# Patient Record
Sex: Male | Born: 1960 | Race: Black or African American | Hispanic: No | Marital: Single | Smoking: Former smoker
Health system: Southern US, Community
[De-identification: ages and names within clinical notes are randomized; demographics above are authoritative.]

## PROBLEM LIST (undated history)

## (undated) DIAGNOSIS — M545 Low back pain: Secondary | ICD-10-CM

## (undated) DIAGNOSIS — K56600 Partial intestinal obstruction, unspecified as to cause: Secondary | ICD-10-CM

## (undated) DIAGNOSIS — B1910 Unspecified viral hepatitis B without hepatic coma: Secondary | ICD-10-CM

## (undated) DIAGNOSIS — G8929 Other chronic pain: Secondary | ICD-10-CM

## (undated) DIAGNOSIS — F319 Bipolar disorder, unspecified: Secondary | ICD-10-CM

## (undated) HISTORY — PX: TUMOR EXCISION: SHX421

## (undated) HISTORY — PX: SHOULDER OPEN ROTATOR CUFF REPAIR: SHX2407

## (undated) HISTORY — PX: CATARACT EXTRACTION W/ INTRAOCULAR LENS IMPLANT: SHX1309

---

## 2003-02-27 ENCOUNTER — Emergency Department (HOSPITAL_COMMUNITY): Admission: EM | Admit: 2003-02-27 | Discharge: 2003-02-27 | Payer: Self-pay | Admitting: Emergency Medicine

## 2005-01-02 ENCOUNTER — Emergency Department (HOSPITAL_COMMUNITY): Admission: EM | Admit: 2005-01-02 | Discharge: 2005-01-02 | Payer: Self-pay | Admitting: Emergency Medicine

## 2009-07-12 HISTORY — PX: ANKLE FUSION: SHX881

## 2009-11-27 ENCOUNTER — Emergency Department (HOSPITAL_COMMUNITY): Admission: EM | Admit: 2009-11-27 | Discharge: 2009-11-28 | Payer: Self-pay | Admitting: Emergency Medicine

## 2010-02-27 ENCOUNTER — Emergency Department (HOSPITAL_COMMUNITY): Admission: EM | Admit: 2010-02-27 | Discharge: 2010-02-27 | Payer: Self-pay | Admitting: Emergency Medicine

## 2010-09-28 LAB — BASIC METABOLIC PANEL
BUN: 16 mg/dL (ref 6–23)
CO2: 29 mEq/L (ref 19–32)
Chloride: 104 mEq/L (ref 96–112)
GFR calc Af Amer: >60 mL/min (ref >60)
GFR calc non Af Amer: >60 mL/min (ref >60)
Glucose, Bld: 102 mg/dL — ABNORMAL HIGH (ref 70–99)
Potassium: 3.9 mEq/L (ref 3.5–5.1)
Sodium: 137 mEq/L (ref 135–145)

## 2010-09-28 LAB — URINALYSIS, ROUTINE W REFLEX MICROSCOPIC
Bilirubin Urine: NEGATIVE
Ketones, ur: NEGATIVE mg/dL
Protein, ur: NEGATIVE mg/dL
Specific Gravity, Urine: 1.029 (ref 1.005–1.030)
Urobilinogen, UA: 1 mg/dL (ref 0.0–1.0)

## 2010-09-28 LAB — DIFFERENTIAL
Basophils Relative: 1 % (ref 0–1)
Eosinophils Relative: 2 % (ref 0–5)
Lymphocytes Relative: 44 % (ref 12–46)
Monocytes Absolute: 0.9 10*3/uL (ref 0.1–1.0)
Monocytes Relative: 12 % (ref 3–12)
Neutro Abs: 3 10*3/uL (ref 1.7–7.7)

## 2010-09-28 LAB — CBC
Hemoglobin: 14.4 g/dL (ref 13.0–17.0)
Platelets: 229 10*3/uL (ref 150–400)
RDW: 13.6 % (ref 11.5–15.5)
WBC: 7.3 10*3/uL (ref 4.0–10.5)

## 2011-12-09 ENCOUNTER — Encounter (HOSPITAL_COMMUNITY): Payer: Self-pay | Admitting: *Deleted

## 2011-12-09 ENCOUNTER — Emergency Department (HOSPITAL_COMMUNITY)
Admission: EM | Admit: 2011-12-09 | Discharge: 2011-12-09 | Disposition: A | Discharge status: Home / Self Care | Payer: Medicaid Other | Attending: Emergency Medicine | Admitting: Emergency Medicine

## 2011-12-09 DIAGNOSIS — Z87891 Personal history of nicotine dependence: Secondary | ICD-10-CM | POA: Insufficient documentation

## 2011-12-09 DIAGNOSIS — J020 Streptococcal pharyngitis: Secondary | ICD-10-CM

## 2011-12-09 LAB — RAPID STREP SCREEN (MED CTR MEBANE ONLY): Streptococcus, Group A Screen (Direct): POSITIVE — AB

## 2011-12-09 MED ORDER — PENICILLIN G BENZATHINE 1200000 UNIT/2ML IM SUSP
1.2000 10*6.[IU] | Freq: Once | INTRAMUSCULAR | Status: AC
  Administered 2011-12-09: 1.2 10*6.[IU] via INTRAMUSCULAR
  Filled 2011-12-09: qty 2

## 2011-12-09 NOTE — ED Notes (Signed)
Patient states he startted with chills yesterday.

## 2011-12-09 NOTE — ED Provider Notes (Signed)
Medical screening examination/treatment/procedure(s) were performed by non-physician practitioner and as supervising physician I was immediately available for consultation/collaboration.   Mitul Hallowell L Aubryana Vittorio, MD 12/09/11 0422 

## 2011-12-09 NOTE — Discharge Instructions (Signed)
You were seen and evaluated for your sore throat symptoms. You were treated with a one-time dose of penicillin for a positive strep throat test. Your symptoms should improve over the next few days. Continue drink plenty of fluids to rehydrate it. Take him on ibuprofen for pain and fever. Oh (primary care provider for any worsening symptoms.    Strep Throat Strep throat is an infection of the throat caused by a bacteria named Streptococcus pyogenes. Your caregiver may call the infection streptococcal "tonsillitis" or "pharyngitis" depending on whether there are signs of inflammation in the tonsils or back of the throat. Strep throat is most common in children from 50 to 52 years old during the cold months of the year, but it can occur in people of any age during any season. This infection is spread from person to person (contagious) through coughing, sneezing, or other close contact. SYMPTOMS   Fever or chills.   Painful, swollen, red tonsils or throat.   Pain or difficulty when swallowing.   White or yellow spots on the tonsils or throat.   Swollen, tender lymph nodes or "glands" of the neck or under the jaw.   Red rash all over the body (rare).  DIAGNOSIS  Many different infections can cause the same symptoms. A test must be done to confirm the diagnosis so the right treatment can be given. A "rapid strep test" can help your caregiver make the diagnosis in a few minutes. If this test is not available, a light swab of the infected area can be used for a throat culture test. If a throat culture test is done, results are usually available in a day or two. TREATMENT  Strep throat is treated with antibiotic medicine. HOME CARE INSTRUCTIONS   Gargle with 1 tsp of salt in 1 cup of warm water, 3 to 4 times per day or as needed for comfort.   Family members who also have a sore throat or fever should be tested for strep throat and treated with antibiotics if they have the strep infection.   Make  sure everyone in your household washes their hands well.   Do not share food, drinking cups, or personal items that could cause the infection to spread to others.   You may need to eat a soft food diet until your sore throat gets better.   Drink enough water and fluids to keep your urine clear or pale yellow. This will help prevent dehydration.   Get plenty of rest.   Stay home from school, daycare, or work until you have been on antibiotics for 24 hours.   Only take over-the-counter or prescription medicines for pain, discomfort, or fever as directed by your caregiver.   If antibiotics are prescribed, take them as directed. Finish them even if you start to feel better.  SEEK MEDICAL CARE IF:   The glands in your neck continue to enlarge.   You develop a rash, cough, or earache.   You cough up green, yellow-brown, or bloody sputum.   You have pain or discomfort not controlled by medicines.   Your problems seem to be getting worse rather than better.  SEEK IMMEDIATE MEDICAL CARE IF:   You develop any new symptoms such as vomiting, severe headache, stiff or painful neck, chest pain, shortness of breath, or trouble swallowing.   You develop severe throat pain, drooling, or changes in your voice.   You develop swelling of the neck, or the skin on the neck becomes red and  tender.   You have a fever.   You develop signs of dehydration, such as fatigue, dry mouth, and decreased urination.   You become increasingly sleepy, or you cannot wake up completely.  Document Released: 06/25/2000 Document Revised: 06/17/2011 Document Reviewed: 08/27/2010 [facility] Patient Information 2012 [place], [place].     RESOURCE GUIDE  Dental Problems  Patients with Medicaid: [facility] [phone] [address]                                           [phone] [address] Phone:  [phone]                                                  Phone:   [phone]  If unable to pay or uninsured, contact:  [facility] or [facility]. to become qualified for the adult dental clinic.  Chronic Pain Problems Contact [facility]  [phone] Patients need to be referred by their primary care doctor.  Insufficient Money for Medicine Contact [employer]:  call "211" or [facility] [phone].  No Primary Care Doctor Call [facility]  [phone] Other agencies that provide inexpensive medical care    [facility]  [phone]    [facility]  [phone]    [facility]  [phone]    [facility]  [phone]    [facility]  [phone]    [facility]  [phone]  Psychological Services [facility]  [phone] [facility]  [phone] [facility] Mental [facility]   [phone] (emergency services [phone])  Substance Abuse Resources [facility]  [phone] [facility] [phone] [facility] [place] [phone] Floydene Flock [phone] Residential & Outpatient Substance Abuse Program  [phone]  Abuse/Neglect [facility] Child Abuse Hotline 939-726-2534 [facility] Child Abuse Hotline [phone] (After Hours)  Emergency Shelter [facility] [phone]  Maternity Homes [facility] [place] [facility] [phone] [facility] [phone]  MRSA Hotline #:   248-416-9265    [facility] Resources  [facility]     [employer]                          [facility] Dept. [address] [place]                       [address]      [address]  [place]                                                Cristobal Goldmann Phone:  [phone]  Phone:  [phone]                 Phone:  [phone]  [facility] Mental Health Phone:   [phone]  [facility] Child Abuse Hotline [phone] [phone] (After Hours)

## 2011-12-09 NOTE — ED Provider Notes (Signed)
History     CSN: [id number]  Arrival date & time 12/09/11  0016   First MD Initiated Contact with Patient 12/09/11 0030      Chief Complaint  Patient presents with  . Sore Throat   HPI  History provided by the patient. Patient is a 50 year old male with no significant past medical history who presents with complaints of sore throat began yesterday. Patient states that he thinks he got it from his girlfriend who had a sore throat past few days. Her symptoms have clear. Patient states now his throat feels swollen and painful. Pain is worse with swallowing. He denies any difficulty swallowing or eating. Patient denies any difficulty breathing. He denies any cough, rhinorrhea or congestion. He does report some subjective fevers, chills. Patient denies any nausea vomiting symptoms.    History reviewed. No pertinent past medical history.  Past Surgical History  Procedure Date  . Ankle fusion   . Rotator cuff repair     No family history on file.  History  Substance Use Topics  . Smoking status: Former Smoker -- 0.5 packs/day    Types: Cigarettes  . Smokeless tobacco: Not on file  . Alcohol Use: 1.8 oz/week    3 Cans of beer per week      Review of Systems  Constitutional: Positive for fever and chills.  HENT: Positive for sore throat. Negative for congestion, rhinorrhea and trouble swallowing.   Respiratory: Negative for cough and shortness of breath.   Cardiovascular: Negative for chest pain.  Gastrointestinal: Negative for nausea and vomiting.    Allergies  Review of patient's allergies indicates no known allergies.  Home Medications  No current outpatient prescriptions on file.  BP 149/79  Pulse 107  Temp(Src) 98.9 F (37.2 C) (Oral)  Resp 20  SpO2 95%  Physical Exam  Nursing note and vitals reviewed. Constitutional: He appears well-developed and well-nourished. No distress.  HENT:  Head: Normocephalic and atraumatic.       Tonsils mildly enlarged with  erythema bilaterally. There is also bilateral exudate. Uvula midline. No signs for PTA.  Neck: Normal range of motion. Neck supple.       No meningeal signs  Cardiovascular: Normal rate and regular rhythm.   Pulmonary/Chest: Effort normal and breath sounds normal.  Abdominal: Soft.  Lymphadenopathy:    He has cervical adenopathy.  Neurological: He is alert.  Skin: Skin is warm. No rash noted.  Psychiatric: He has a normal mood and affect. His behavior is normal.    ED Course  Procedures  Results for orders placed during the hospital encounter of 12/09/11  RAPID STREP SCREEN      Component Value Range   Streptococcus, Group A Screen (Direct) POSITIVE (*) NEGATIVE        1. Strep throat       MDM  Patient seen and evaluated. Patient no acute distress.  Patient treated with a one-time dose of penicillin for strep throat.      Angus Seller, [place] 12/09/11 [phone]

## 2011-12-09 NOTE — ED Notes (Signed)
Pt c/o sore throat since yesterday.  Pt feels it is so painful he cannot swallow.

## 2012-03-06 ENCOUNTER — Inpatient Hospital Stay (HOSPITAL_COMMUNITY)
Admission: EM | Admit: 2012-03-06 | Discharge: 2012-03-09 | DRG: 390 | Disposition: A | Discharge status: Home / Self Care | Payer: Medicaid Other | Attending: Surgery | Admitting: Surgery

## 2012-03-06 ENCOUNTER — Encounter (HOSPITAL_COMMUNITY): Payer: Self-pay | Admitting: Adult Health

## 2012-03-06 DIAGNOSIS — F1911 Other psychoactive substance abuse, in remission: Secondary | ICD-10-CM | POA: Diagnosis present

## 2012-03-06 DIAGNOSIS — E669 Obesity, unspecified: Secondary | ICD-10-CM | POA: Diagnosis present

## 2012-03-06 DIAGNOSIS — Z8619 Personal history of other infectious and parasitic diseases: Secondary | ICD-10-CM

## 2012-03-06 DIAGNOSIS — Z87891 Personal history of nicotine dependence: Secondary | ICD-10-CM

## 2012-03-06 DIAGNOSIS — K56609 Unspecified intestinal obstruction, unspecified as to partial versus complete obstruction: Principal | ICD-10-CM | POA: Diagnosis present

## 2012-03-06 DIAGNOSIS — K56600 Partial intestinal obstruction, unspecified as to cause: Secondary | ICD-10-CM | POA: Diagnosis present

## 2012-03-06 DIAGNOSIS — R7401 Elevation of levels of liver transaminase levels: Secondary | ICD-10-CM | POA: Diagnosis present

## 2012-03-06 DIAGNOSIS — R112 Nausea with vomiting, unspecified: Secondary | ICD-10-CM

## 2012-03-06 DIAGNOSIS — K429 Umbilical hernia without obstruction or gangrene: Secondary | ICD-10-CM | POA: Diagnosis present

## 2012-03-06 DIAGNOSIS — Z6835 Body mass index (BMI) 35.0-35.9, adult: Secondary | ICD-10-CM

## 2012-03-06 DIAGNOSIS — R7402 Elevation of levels of lactic acid dehydrogenase (LDH): Secondary | ICD-10-CM | POA: Diagnosis present

## 2012-03-06 DIAGNOSIS — Z23 Encounter for immunization: Secondary | ICD-10-CM

## 2012-03-06 HISTORY — DX: Partial intestinal obstruction, unspecified as to cause: K56.600

## 2012-03-06 HISTORY — DX: Other chronic pain: G89.29

## 2012-03-06 HISTORY — DX: Unspecified viral hepatitis B without hepatic coma: B19.10

## 2012-03-06 HISTORY — DX: Low back pain: M54.5

## 2012-03-06 HISTORY — DX: Bipolar disorder, unspecified: F31.9

## 2012-03-06 LAB — CBC WITH DIFFERENTIAL/PLATELET
Eosinophils Absolute: 0 10*3/uL (ref 0.0–0.7)
Eosinophils Relative: 0 % (ref 0–5)
HCT: 47 % (ref 39.0–52.0)
Hemoglobin: 16.6 g/dL (ref 13.0–17.0)
MCH: 27.7 pg (ref 26.0–34.0)
Monocytes Absolute: 0.5 10*3/uL (ref 0.1–1.0)
Neutrophils Relative %: 69 % (ref 43–77)
Platelets: 306 10*3/uL (ref 150–400)
RDW: 13.2 % (ref 11.5–15.5)

## 2012-03-06 LAB — COMPREHENSIVE METABOLIC PANEL
ALT: 72 U/L — ABNORMAL HIGH (ref 0–53)
AST: 55 U/L — ABNORMAL HIGH (ref 0–37)
Albumin: 4.7 g/dL (ref 3.5–5.2)
CO2: 21 mEq/L (ref 19–32)
Chloride: 101 mEq/L (ref 96–112)
Creatinine, Ser: 0.99 mg/dL (ref 0.50–1.35)
GFR calc Af Amer: >90 mL/min (ref >90)
GFR calc non Af Amer: >90 mL/min (ref >90)
Glucose, Bld: 152 mg/dL — ABNORMAL HIGH (ref 70–99)
Sodium: 138 mEq/L (ref 135–145)

## 2012-03-06 LAB — URINALYSIS, ROUTINE W REFLEX MICROSCOPIC
Bilirubin Urine: NEGATIVE
Hgb urine dipstick: NEGATIVE
Leukocytes, UA: NEGATIVE
Nitrite: NEGATIVE
Protein, ur: 100 mg/dL — AB
Urobilinogen, UA: 0.2 mg/dL (ref 0.0–1.0)
pH: 5.5 (ref 5.0–8.0)

## 2012-03-06 NOTE — ED Notes (Addendum)
C/o upper abdominal pain, nausea and vomiting that started yesterday. Pt states he is unable to keep anything on stomach. Normal BM yesterday. Denies Chest pain.  States the "pain feels like snakes in my belly"

## 2012-03-07 ENCOUNTER — Emergency Department (HOSPITAL_COMMUNITY): Payer: Medicaid Other

## 2012-03-07 ENCOUNTER — Encounter (HOSPITAL_COMMUNITY): Payer: Self-pay | Admitting: Physical Medicine and Rehabilitation

## 2012-03-07 DIAGNOSIS — R109 Unspecified abdominal pain: Secondary | ICD-10-CM

## 2012-03-07 DIAGNOSIS — R112 Nausea with vomiting, unspecified: Secondary | ICD-10-CM

## 2012-03-07 DIAGNOSIS — K56600 Partial intestinal obstruction, unspecified as to cause: Secondary | ICD-10-CM

## 2012-03-07 HISTORY — DX: Partial intestinal obstruction, unspecified as to cause: K56.600

## 2012-03-07 MED ORDER — SODIUM CHLORIDE 0.9 % IV SOLN
Freq: Once | INTRAVENOUS | Status: AC
  Administered 2012-03-07: 06:00:00 via INTRAVENOUS

## 2012-03-07 MED ORDER — BIOTENE DRY MOUTH MT LIQD
15.0000 mL | Freq: Two times a day (BID) | OROMUCOSAL | Status: DC
  Administered 2012-03-07 – 2012-03-08 (×3): 15 mL via OROMUCOSAL

## 2012-03-07 MED ORDER — ONDANSETRON HCL 4 MG/2ML IJ SOLN
4.0000 mg | Freq: Once | INTRAMUSCULAR | Status: AC
  Administered 2012-03-07: 4 mg via INTRAVENOUS
  Filled 2012-03-07: qty 2

## 2012-03-07 MED ORDER — ONDANSETRON HCL 4 MG/2ML IJ SOLN: 4.0000 mg | Freq: Four times a day (QID) | INTRAMUSCULAR | Status: DC | PRN

## 2012-03-07 MED ORDER — KCL IN DEXTROSE-NACL 20-5-0.45 MEQ/L-%-% IV SOLN
INTRAVENOUS | Status: DC
  Administered 2012-03-07 – 2012-03-08 (×4): via INTRAVENOUS
  Filled 2012-03-07 (×6): qty 1000

## 2012-03-07 MED ORDER — IOHEXOL 300 MG/ML  SOLN
40.0000 mL | Freq: Once | INTRAMUSCULAR | Status: AC | PRN
  Administered 2012-03-07: 40 mL via ORAL

## 2012-03-07 MED ORDER — WHITE PETROLATUM GEL
Status: AC
  Administered 2012-03-07: 14:00:00
  Filled 2012-03-07: qty 5

## 2012-03-07 MED ORDER — MORPHINE SULFATE 4 MG/ML IJ SOLN
4.0000 mg | Freq: Once | INTRAMUSCULAR | Status: AC
  Administered 2012-03-07: 4 mg via INTRAVENOUS
  Filled 2012-03-07: qty 1

## 2012-03-07 MED ORDER — SODIUM CHLORIDE 0.9 % IV BOLUS (SEPSIS)
1000.0000 mL | Freq: Once | INTRAVENOUS | Status: AC
  Administered 2012-03-07: 1000 mL via INTRAVENOUS

## 2012-03-07 MED ORDER — MORPHINE SULFATE 2 MG/ML IJ SOLN
1.0000 mg | INTRAMUSCULAR | Status: DC | PRN
  Administered 2012-03-07 – 2012-03-08 (×5): 2 mg via INTRAVENOUS
  Filled 2012-03-07 (×5): qty 1

## 2012-03-07 MED ORDER — PNEUMOCOCCAL VAC POLYVALENT 25 MCG/0.5ML IJ INJ
0.5000 mL | INJECTION | INTRAMUSCULAR | Status: AC
  Administered 2012-03-08: 0.5 mL via INTRAMUSCULAR
  Filled 2012-03-07: qty 0.5

## 2012-03-07 MED ORDER — IOHEXOL 300 MG/ML  SOLN: 20.0000 mL | INTRAMUSCULAR | Status: DC

## 2012-03-07 MED ORDER — ENOXAPARIN SODIUM 40 MG/0.4ML ~~LOC~~ SOLN
40.0000 mg | SUBCUTANEOUS | Status: DC
  Administered 2012-03-07 – 2012-03-09 (×3): 40 mg via SUBCUTANEOUS
  Filled 2012-03-07 (×3): qty 0.4

## 2012-03-07 MED ORDER — CHLORHEXIDINE GLUCONATE 0.12 % MT SOLN
15.0000 mL | Freq: Two times a day (BID) | OROMUCOSAL | Status: DC
  Administered 2012-03-07 – 2012-03-09 (×3): 15 mL via OROMUCOSAL
  Filled 2012-03-07: qty 15

## 2012-03-07 MED ORDER — IOHEXOL 300 MG/ML  SOLN
100.0000 mL | Freq: Once | INTRAMUSCULAR | Status: AC | PRN
  Administered 2012-03-07: 100 mL via INTRAVENOUS

## 2012-03-07 MED ORDER — PANTOPRAZOLE SODIUM 40 MG IV SOLR
40.0000 mg | Freq: Every day | INTRAVENOUS | Status: DC
  Administered 2012-03-07 – 2012-03-08 (×2): 40 mg via INTRAVENOUS
  Filled 2012-03-07 (×3): qty 40

## 2012-03-07 NOTE — ED Notes (Signed)
Pt returned from CT and reporting increased abd pain. EDP updated.

## 2012-03-07 NOTE — ED Provider Notes (Signed)
History     CSN: [id number]  Arrival date & time 03/06/12  2132   First MD Initiated Contact with Patient 03/07/12 0002      Chief Complaint  Patient presents with  . Emesis   HPI  History provided by the patient. Patient is a 51 year old [place] male with history of hepatitis presents with complaints of diffuse abdominal pain and cramping with episodes of vomiting that began yesterday. Patient states symptoms came on acutely short after eating. He denies eating any unusual foods or undercooked meats. He denies any fever, chills or sweats. No constipation or diarrhea. Abdominal pain is diffuse and intermittent. Patient states pains feel like "snakes crawling and belly". He denies any hematemesis. Patient denies having similar symptoms previously. Patient denies any recent heavy alcohol use and states that he recently quit. Patient denies ever having heavy or daily alcohol use. No prior history of pancreatitis. Patient denies any recent travel. No known sick contacts.     Past Medical History  Diagnosis Date  . Hepatitis     Past Surgical History  Procedure Date  . Ankle fusion   . Rotator cuff repair     No family history on file.  History  Substance Use Topics  . Smoking status: Former Smoker -- 0.5 packs/day    Types: Cigarettes  . Smokeless tobacco: Not on file  . Alcohol Use: 1.8 oz/week    3 Cans of beer per week      Review of Systems  Constitutional: Negative for fever, chills and appetite change.  Respiratory: Negative for cough and shortness of breath.   Gastrointestinal: Positive for abdominal pain. Negative for nausea, vomiting, diarrhea and constipation.  Genitourinary: Negative for dysuria, frequency and hematuria.    Allergies  Review of patient's allergies indicates no known allergies.  Home Medications  No current outpatient prescriptions on file.  BP 147/96  Pulse 71  Temp 98 F (36.7 C)  Resp 18  SpO2 96%  Physical Exam    Nursing note and vitals reviewed. Constitutional: He is oriented to person, place, and time. He appears well-developed and well-nourished. No distress.  HENT:  Head: Normocephalic.  Cardiovascular: Normal rate and regular rhythm.   Pulmonary/Chest: Effort normal and breath sounds normal. No respiratory distress. He has no wheezes. He has no rales.  Abdominal: Soft. Bowel sounds are normal. He exhibits distension. He exhibits no mass. There is tenderness. There is no rebound and no guarding. Hernia confirmed negative in the right inguinal area and confirmed negative in the left inguinal area.       Diffuse abdominal tenderness. There is a soft reducible small umbilical hernia.  Genitourinary: Testes normal. Uncircumcised.  Neurological: He is alert and oriented to person, place, and time.  Skin: Skin is warm.  Psychiatric: He has a normal mood and affect. His behavior is normal.    ED Course  Procedures   Results for orders placed during the hospital encounter of 03/06/12  CBC WITH DIFFERENTIAL      Component Value Range   WBC 8.0  4.0 - 10.5 K/uL   RBC 5.99 (*) 4.22 - 5.81 MIL/uL   Hemoglobin 16.6  13.0 - 17.0 g/dL   HCT 40.9  81.1 - 91.4 %   MCV 78.5  78.0 - 100.0 fL   MCH 27.7  26.0 - 34.0 pg   MCHC 35.3  30.0 - 36.0 g/dL   RDW 78.2  95.6 - 21.3 %   Platelets 306  150 - 400  K/uL   Neutrophils Relative 69  43 - 77 %   Neutro Abs 5.6  1.7 - 7.7 K/uL   Lymphocytes Relative 25  12 - 46 %   Lymphs Abs 2.0  0.7 - 4.0 K/uL   Monocytes Relative 6  3 - 12 %   Monocytes Absolute 0.5  0.1 - 1.0 K/uL   Eosinophils Relative 0  0 - 5 %   Eosinophils Absolute 0.0  0.0 - 0.7 K/uL   Basophils Relative 0  0 - 1 %   Basophils Absolute 0.0  0.0 - 0.1 K/uL  COMPREHENSIVE METABOLIC PANEL      Component Value Range   Sodium 138  135 - 145 mEq/L   Potassium 4.3  3.5 - 5.1 mEq/L   Chloride 101  96 - 112 mEq/L   CO2 21  19 - 32 mEq/L   Glucose, Bld 152 (*) 70 - 99 mg/dL   BUN 18  6 - 23 mg/dL    Creatinine, Ser 4.09  0.50 - 1.35 mg/dL   Calcium 81.1 (*) 8.4 - 10.5 mg/dL   Total Protein 9.3 (*) 6.0 - 8.3 g/dL   Albumin 4.7  3.5 - 5.2 g/dL   AST 55 (*) 0 - 37 U/L   ALT 72 (*) 0 - 53 U/L   Alkaline Phosphatase 62  39 - 117 U/L   Total Bilirubin 0.5  0.3 - 1.2 mg/dL   GFR calc non Af Amer >90  >90 mL/min   GFR calc Af Amer >90  >90 mL/min  LIPASE, BLOOD      Component Value Range   Lipase 26  11 - 59 U/L  URINALYSIS, ROUTINE W REFLEX MICROSCOPIC      Component Value Range   Color, Urine AMBER (*) YELLOW   APPearance CLEAR  CLEAR   Specific Gravity, Urine 1.040 (*) 1.005 - 1.030   pH 5.5  5.0 - 8.0   Glucose, UA NEGATIVE  NEGATIVE mg/dL   Hgb urine dipstick NEGATIVE  NEGATIVE   Bilirubin Urine NEGATIVE  NEGATIVE   Ketones, ur 15 (*) NEGATIVE mg/dL   Protein, ur 914 (*) NEGATIVE mg/dL   Urobilinogen, UA 0.2  0.0 - 1.0 mg/dL   Nitrite NEGATIVE  NEGATIVE   Leukocytes, UA NEGATIVE  NEGATIVE  URINE MICROSCOPIC-ADD ON      Component Value Range   Urine-Other MUCOUS PRESENT        Ct Abdomen Pelvis W Contrast  03/07/2012  *RADIOLOGY REPORT*  Clinical Data: Evaluate for small bowel obstruction  CT ABDOMEN AND PELVIS WITH CONTRAST  Technique:  Multidetector CT imaging of the abdomen and pelvis was performed following the standard protocol during bolus administration of intravenous contrast.  Contrast: 40mL OMNIPAQUE IOHEXOL 300 MG/ML  SOLN, OMNIPAQUE IOHEXOL 300 MG/ML  SOLN  Comparison: 03/07/2012  Findings: Limited images through the lung bases demonstrate no significant appreciable abnormality. The heart size is within normal limits. No pleural or pericardial effusion.  Hepatic steatosis.  Unremarkable spleen, pancreas, adrenal glands, kidneys.  No hydronephrosis or hydroureter.  Decompressed colon and decompressed proximal small bowel loops. Normal appendix.  Mid and distal small bowel loops are mildly dilated up to 3.2 cm, and contain air fluid levels.  There is  fecalizatoin of a loop of distal ileum with interloop fluid involving the small bowel mesentery just proximal to this level. Small amount of perihepatic and perisplenic fluid measures water attenuation.  The SMA and SMV are patent.  Normal caliber aorta.  Thin-walled bladder.  No deep pelvic lymphadenopathy.  Small fat containing left inguinal hernia.  Multilevel degenerative changes of the imaged spine. No acute or aggressive appearing osseous lesion.  IMPRESSION: Delayed transit through distal small bowel with associated proximal dilatation is in keeping with a partial obstruction pattern.  The etiology for the delayed transit is uncertain.  May reflect an underlying enteritis (infectious, inflammatory, and ischemic considerations) or early mechanical obstruction.  Recommend correlation with clinical symptoms and KUB follow-up to document continued passage of ingested contrast if no intervention is performed.  Hepatic steatosis.  Small amount of free fluid is nonspecific.   Original Report Authenticated By: Waneta Martins, M.D.    Dg Abd Acute W/chest  03/07/2012  *RADIOLOGY REPORT*  Clinical Data: Abdominal pain and vomiting.  ACUTE ABDOMEN SERIES (ABDOMEN 2 VIEW & CHEST 1 VIEW)  Comparison: CT abdomen and pelvis 11/28/2009.  Findings: Single view of the chest demonstrates clear lungs and normal heart size.  No pneumothorax or pleural fluid.  Two views of the abdomen show no free intraperitoneal air.  Air fluid levels are seen in small bowel in the left upper quadrant of the abdomen with small bowel loops dilated up to 3.5 cm identified. A small amount of gas is seen in the colon.  No unexpected calcification.  IMPRESSION:  1.  Findings worrisome for small bowel obstruction. 2.  Negative for free intraperitoneal air.   Original Report Authenticated By: Bernadene Bell. D'ALESSIO, M.D.      1. Small bowel obstruction   2. Nausea & vomiting       MDM  12:00 AM patient seen and evaluated. Patient appears  slightly uncomfortable. Patient is not appear in any acute distress. Vital signs unremarkable.  Pt having improvement of pain after morphine.  X-rays show signs concerning for SBO.  Pt with no abd surgical hx.  Soft reducible umbilical hernia.  No inguinal hernias appreciated.  Will get CT.  CT concerning for partial SBO.  Cause unclear.  Pt seen and discussed with Attending Physicain, will plan to consult gen surgery and consider possible admission.  Will also place NG tube.  Angus Seller, [place] 03/07/12 [phone]

## 2012-03-07 NOTE — Progress Notes (Signed)
UR complete 

## 2012-03-07 NOTE — ED Provider Notes (Signed)
Medical screening examination/treatment/procedure(s) were conducted as a shared visit with non-physician practitioner(s) and myself.  I personally evaluated the patient during the encounter.  On exam after morphine x 2, still having pain with some nausea. C/o distension. Mild diffuse tenderness. No guarding. CT reviewed as below:  Results for orders placed during the hospital encounter of 03/06/12  CBC WITH DIFFERENTIAL      Component Value Range   WBC 8.0  4.0 - 10.5 K/uL   RBC 5.99 (*) 4.22 - 5.81 MIL/uL   Hemoglobin 16.6  13.0 - 17.0 g/dL   HCT 16.1  09.6 - 04.5 %   MCV 78.5  78.0 - 100.0 fL   MCH 27.7  26.0 - 34.0 pg   MCHC 35.3  30.0 - 36.0 g/dL   RDW 40.9  81.1 - 91.4 %   Platelets 306  150 - 400 K/uL   Neutrophils Relative 69  43 - 77 %   Neutro Abs 5.6  1.7 - 7.7 K/uL   Lymphocytes Relative 25  12 - 46 %   Lymphs Abs 2.0  0.7 - 4.0 K/uL   Monocytes Relative 6  3 - 12 %   Monocytes Absolute 0.5  0.1 - 1.0 K/uL   Eosinophils Relative 0  0 - 5 %   Eosinophils Absolute 0.0  0.0 - 0.7 K/uL   Basophils Relative 0  0 - 1 %   Basophils Absolute 0.0  0.0 - 0.1 K/uL  COMPREHENSIVE METABOLIC PANEL      Component Value Range   Sodium 138  135 - 145 mEq/L   Potassium 4.3  3.5 - 5.1 mEq/L   Chloride 101  96 - 112 mEq/L   CO2 21  19 - 32 mEq/L   Glucose, Bld 152 (*) 70 - 99 mg/dL   BUN 18  6 - 23 mg/dL   Creatinine, Ser 7.82  0.50 - 1.35 mg/dL   Calcium 95.6 (*) 8.4 - 10.5 mg/dL   Total Protein 9.3 (*) 6.0 - 8.3 g/dL   Albumin 4.7  3.5 - 5.2 g/dL   AST 55 (*) 0 - 37 U/L   ALT 72 (*) 0 - 53 U/L   Alkaline Phosphatase 62  39 - 117 U/L   Total Bilirubin 0.5  0.3 - 1.2 mg/dL   GFR calc non Af Amer >90  >90 mL/min   GFR calc Af Amer >90  >90 mL/min  LIPASE, BLOOD      Component Value Range   Lipase 26  11 - 59 U/L  URINALYSIS, ROUTINE W REFLEX MICROSCOPIC      Component Value Range   Color, Urine AMBER (*) YELLOW   APPearance CLEAR  CLEAR   Specific Gravity, Urine 1.040 (*)  1.005 - 1.030   pH 5.5  5.0 - 8.0   Glucose, UA NEGATIVE  NEGATIVE mg/dL   Hgb urine dipstick NEGATIVE  NEGATIVE   Bilirubin Urine NEGATIVE  NEGATIVE   Ketones, ur 15 (*) NEGATIVE mg/dL   Protein, ur 213 (*) NEGATIVE mg/dL   Urobilinogen, UA 0.2  0.0 - 1.0 mg/dL   Nitrite NEGATIVE  NEGATIVE   Leukocytes, UA NEGATIVE  NEGATIVE  URINE MICROSCOPIC-ADD ON      Component Value Range   Urine-Other MUCOUS PRESENT     Ct Abdomen Pelvis W Contrast  03/07/2012  *RADIOLOGY REPORT*  Clinical Data: Evaluate for small bowel obstruction  CT ABDOMEN AND PELVIS WITH CONTRAST  Technique:  Multidetector CT imaging of the abdomen and pelvis  was performed following the standard protocol during bolus administration of intravenous contrast.  Contrast: 40mL OMNIPAQUE IOHEXOL 300 MG/ML  SOLN, OMNIPAQUE IOHEXOL 300 MG/ML  SOLN  Comparison: 03/07/2012  Findings: Limited images through the lung bases demonstrate no significant appreciable abnormality. The heart size is within normal limits. No pleural or pericardial effusion.  Hepatic steatosis.  Unremarkable spleen, pancreas, adrenal glands, kidneys.  No hydronephrosis or hydroureter.  Decompressed colon and decompressed proximal small bowel loops. Normal appendix.  Mid and distal small bowel loops are mildly dilated up to 3.2 cm, and contain air fluid levels.  There is fecalizatoin of a loop of distal ileum with interloop fluid involving the small bowel mesentery just proximal to this level. Small amount of perihepatic and perisplenic fluid measures water attenuation.  The SMA and SMV are patent.  Normal caliber aorta.  Thin-walled bladder.  No deep pelvic lymphadenopathy.  Small fat containing left inguinal hernia.  Multilevel degenerative changes of the imaged spine. No acute or aggressive appearing osseous lesion.  IMPRESSION: Delayed transit through distal small bowel with associated proximal dilatation is in keeping with a partial obstruction pattern.  The etiology  for the delayed transit is uncertain.  May reflect an underlying enteritis (infectious, inflammatory, and ischemic considerations) or early mechanical obstruction.  Recommend correlation with clinical symptoms and KUB follow-up to document continued passage of ingested contrast if no intervention is performed.  Hepatic steatosis.  Small amount of free fluid is nonspecific.   Original Report Authenticated By: Waneta Martins, M.D.    Dg Abd Acute W/chest  03/07/2012  *RADIOLOGY REPORT*  Clinical Data: Abdominal pain and vomiting.  ACUTE ABDOMEN SERIES (ABDOMEN 2 VIEW & CHEST 1 VIEW)  Comparison: CT abdomen and pelvis 11/28/2009.  Findings: Single view of the chest demonstrates clear lungs and normal heart size.  No pneumothorax or pleural fluid.  Two views of the abdomen show no free intraperitoneal air.  Air fluid levels are seen in small bowel in the left upper quadrant of the abdomen with small bowel loops dilated up to 3.5 cm identified. A small amount of gas is seen in the colon.  No unexpected calcification.  IMPRESSION:  1.  Findings worrisome for small bowel obstruction. 2.  Negative for free intraperitoneal air.   Original Report Authenticated By: Bernadene Bell. D'ALESSIO, M.D.     [facility] c/s: discussed with [facility] - agrees to admit. Will see in the ED  Sunnie Nielsen, MD 03/07/12 [phone]

## 2012-03-07 NOTE — Progress Notes (Signed)
Pt feels well.  No abdominal pain.  NGT with only clear liquid with no bilious tinge.  Will look at dc NGT tonight and see how patient does.  Clete Kuch E 3:51 PM 03/07/2012

## 2012-03-07 NOTE — ED Notes (Signed)
EDP at bedside  

## 2012-03-07 NOTE — Progress Notes (Signed)
Agree.  Derrick Daniel. [name], MD, [facility]  03/07/2012 4:46 PM

## 2012-03-07 NOTE — H&P (Signed)
Derrick Daniel is an 51 y.o. male.   Chief Complaint: abdominal pain HPI: 51 year old obese African American male came to the emergency department late yesterday complaining of abdominal pain, nausea and vomiting. He states that he started feeling ill on Monday. His last meal was on Sunday. He states he did not have an appetite on Monday. He states that he developed abdominal pain sometime late Monday afternoon-evening. He states the pain was in his upper abdomen & points to his right side. He also states he became nauseous and vomited several times. His emesis was nonbloody. He also has felt bloated. He did not have an appetite on Monday. His last bowel movement was on Monday morning. He denies any melena or hematochezia. He states he has not had flatus since Sunday evening. He denies any prior symptoms. He denies any previous abdominal surgery. He denies any weight loss other than what was planned. He normally has a bowel movement every day. He denies any significant family history of any type of intestinal issues or colon cancer. The discomfort did not radiate. He works in Therap[employer], [employer]. 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 Hepatitis     Past Surgical History  Procedure Date  . Ankle fusion   . Rotator cuff repair     No family history on file. Social History:  reports that he has quit smoking. His smoking use included Cigarettes. He smoked .5 packs per day. He does not have any smokeless tobacco history on file. He reports that he drinks about 1.8 ounces of alcohol per week. He reports that he does not use illicit drugs.  Allergies: No Known Allergies   (Not in a hospital admission)  Results for orders placed during the hospital encounter of 03/06/12 (from the past 48 hour(s))  URINALYSIS, ROUTINE W REFLEX MICROSCOPIC     Status: Abnormal   Collection Time   03/06/12 10:08 PM      Component Value Range Comment   Color,  Urine AMBER (*) YELLOW BIOCHEMICALS MAY BE AFFECTED BY COLOR   APPearance CLEAR  CLEAR    Specific Gravity, Urine 1.040 (*) 1.005 - 1.030    pH 5.5  5.0 - 8.0    Glucose, UA NEGATIVE  NEGATIVE mg/dL    Hgb urine dipstick NEGATIVE  NEGATIVE    Bilirubin Urine NEGATIVE  NEGATIVE    Ketones, ur 15 (*) NEGATIVE mg/dL    Protein, ur 782 (*) NEGATIVE mg/dL    Urobilinogen, UA 0.2  0.0 - 1.0 mg/dL    Nitrite NEGATIVE  NEGATIVE    Leukocytes, UA NEGATIVE  NEGATIVE   URINE MICROSCOPIC-ADD ON     Status: Normal   Collection Time   03/06/12 10:08 PM      Component Value Range Comment   Urine-Other MUCOUS PRESENT     CBC WITH DIFFERENTIAL     Status: Abnormal   Collection Time   03/06/12 10:10 PM      Component Value Range Comment   WBC 8.0  4.0 - 10.5 K/uL    RBC 5.99 (*) 4.22 - 5.81 MIL/uL    Hemoglobin 16.6  13.0 - 17.0 g/dL    HCT 95.6  21.3 - 08.6 %    MCV 78.5  78.0 - 100.0 fL    MCH 27.7  26.0 - 34.0 pg    MCHC 35.3  30.0 - 36.0 g/dL    RDW 57.8  11.5 - 15.5 %    Platelets 306  150 - 400 K/uL    Neutrophils Relative 69  43 - 77 %    Neutro Abs 5.6  1.7 - 7.7 K/uL    Lymphocytes Relative 25  12 - 46 %    Lymphs Abs 2.0  0.7 - 4.0 K/uL    Monocytes Relative 6  3 - 12 %    Monocytes Absolute 0.5  0.1 - 1.0 K/uL    Eosinophils Relative 0  0 - 5 %    Eosinophils Absolute 0.0  0.0 - 0.7 K/uL    Basophils Relative 0  0 - 1 %    Basophils Absolute 0.0  0.0 - 0.1 K/uL   COMPREHENSIVE METABOLIC PANEL     Status: Abnormal   Collection Time   03/06/12 10:10 PM      Component Value Range Comment   Sodium 138  135 - 145 mEq/L    Potassium 4.3  3.5 - 5.1 mEq/L    Chloride 101  96 - 112 mEq/L    CO2 21  19 - 32 mEq/L    Glucose, Bld 152 (*) 70 - 99 mg/dL    BUN 18  6 - 23 mg/dL    Creatinine, Ser 8.65  0.50 - 1.35 mg/dL    Calcium 78.4 (*) 8.4 - 10.5 mg/dL    Total Protein 9.3 (*) 6.0 - 8.3 g/dL    Albumin 4.7  3.5 - 5.2 g/dL    AST 55 (*) 0 - 37 U/L    ALT 72 (*) 0 - 53 U/L     Alkaline Phosphatase 62  39 - 117 U/L    Total Bilirubin 0.5  0.3 - 1.2 mg/dL    GFR calc non Af Amer >90  >90 mL/min    GFR calc Af Amer >90  >90 mL/min   LIPASE, BLOOD     Status: Normal   Collection Time   03/06/12 10:10 PM      Component Value Range Comment   Lipase 26  11 - 59 U/L    RADIOLOGICAL STUDIES: I have personally reviewed the radiological exams myself  Ct Abdomen Pelvis W Contrast  03/07/2012  *RADIOLOGY REPORT*  Clinical Data: Evaluate for small bowel obstruction  CT ABDOMEN AND PELVIS WITH CONTRAST  Technique:  Multidetector CT imaging of the abdomen and pelvis was performed following the standard protocol during bolus administration of intravenous contrast.  Contrast: 40mL OMNIPAQUE IOHEXOL 300 MG/ML  SOLN, OMNIPAQUE IOHEXOL 300 MG/ML  SOLN  Comparison: 03/07/2012  Findings: Limited images through the lung bases demonstrate no significant appreciable abnormality. The heart size is within normal limits. No pleural or pericardial effusion.  Hepatic steatosis.  Unremarkable spleen, pancreas, adrenal glands, kidneys.  No hydronephrosis or hydroureter.  Decompressed colon and decompressed proximal small bowel loops. Normal appendix.  Mid and distal small bowel loops are mildly dilated up to 3.2 cm, and contain air fluid levels.  There is fecalizatoin of a loop of distal ileum with interloop fluid involving the small bowel mesentery just proximal to this level. Small amount of perihepatic and perisplenic fluid measures water attenuation.  The SMA and SMV are patent.  Normal caliber aorta.  Thin-walled bladder.  No deep pelvic lymphadenopathy.  Small fat containing left inguinal hernia.  Multilevel degenerative changes of the imaged spine. No acute or aggressive appearing osseous lesion.  IMPRESSION: Delayed transit through distal small bowel with associated proximal dilatation is in keeping with a  partial obstruction pattern.  The etiology for the delayed transit is uncertain.  May  reflect an underlying enteritis (infectious, inflammatory, and ischemic considerations) or early mechanical obstruction.  Recommend correlation with clinical symptoms and KUB follow-up to document continued passage of ingested contrast if no intervention is performed.  Hepatic steatosis.  Small amount of free fluid is nonspecific.   Original Report Authenticated By: Waneta Martins, M.D.    Dg Abd Acute W/chest  03/07/2012  *RADIOLOGY REPORT*  Clinical Data: Abdominal pain and vomiting.  ACUTE ABDOMEN SERIES (ABDOMEN 2 VIEW & CHEST 1 VIEW)  Comparison: CT abdomen and pelvis 11/28/2009.  Findings: Single view of the chest demonstrates clear lungs and normal heart size.  No pneumothorax or pleural fluid.  Two views of the abdomen show no free intraperitoneal air.  Air fluid levels are seen in small bowel in the left upper quadrant of the abdomen with small bowel loops dilated up to 3.5 cm identified. A small amount of gas is seen in the colon.  No unexpected calcification.  IMPRESSION:  1.  Findings worrisome for small bowel obstruction. 2.  Negative for free intraperitoneal air.   Original Report Authenticated By: Bernadene Bell. Maricela Curet, M.D.     Review of Systems  Constitutional: Positive for weight loss (planned - "been training" lost 20 pounds). Negative for fever, chills and diaphoresis.  HENT: Negative for nosebleeds.   Eyes: Negative for blurred vision and double vision.  Respiratory: Negative for shortness of breath and wheezing.   Cardiovascular: Negative for chest pain, orthopnea, leg swelling and PND.  Gastrointestinal: Positive for nausea, vomiting and abdominal pain. Negative for diarrhea, blood in stool and melena.  Genitourinary: Negative for dysuria, urgency and hematuria.  Musculoskeletal: Negative for back pain.  Neurological: Negative for sensory change, seizures, loss of consciousness and headaches.       Denies TIA, amaurosis fugax  Psychiatric/Behavioral: Negative for substance  abuse.       Former IV drug use - quit March 1993; quit drinking etoh 1 month ago, prior to that - drank a 6 pack of beer per day    Blood pressure 131/63, pulse 72, temperature 98 F (36.7 C), resp. rate 16, SpO2 98.00%. Physical Exam  Vitals reviewed. Constitutional: He is oriented to person, place, and time. He appears well-developed and well-nourished. He is cooperative. He does not have a sickly appearance. He does not appear ill. No distress.       Just received 4 mg morphine  HENT:  Head: Normocephalic and atraumatic.  Right Ear: External ear normal.  Left Ear: External ear normal.  Eyes: Conjunctivae are normal. No scleral icterus.  Neck: Normal range of motion. Neck supple. No tracheal deviation present. No thyromegaly present.  Cardiovascular: Normal rate, regular rhythm, normal heart sounds and intact distal pulses.   Respiratory: Effort normal and breath sounds normal. No respiratory distress. He has no wheezes.  GI: Soft. He exhibits distension. He exhibits no ascites. There is no rigidity, no rebound and no guarding.       Very small umbilical hernia; abdomen protuberant, very mild TTP in upper abdomen  Musculoskeletal: Normal range of motion. He exhibits no edema and no tenderness.  Neurological: He is oriented to person, place, and time. No sensory deficit. He exhibits normal muscle tone. GCS eye subscore is 4. GCS verbal subscore is 5. GCS motor subscore is 6.       A little somnolent but ox3; just received 4 of morphine  Skin: Skin is  warm and dry. No rash noted. He is not diaphoretic. No erythema. No pallor.  Psychiatric: He has a normal mood and affect. His behavior is normal. Judgment and thought content normal.     Assessment/Plan Early pSBO Elevated transaminases Obesity Small umbilical hernia  He is distended however he does not have much pain on exam. He did just received morphine just prior to my arrival. His CT scan does not demonstrate significantly  dilated loops of bowel. However I do think he warrants admission for observation. We will make him n.p.o. And place him on bowel rest. We will repeat a set of abdominal films in the morning. We will also repeat his labs to followup on his transaminases. Also in the differential is possible gallbladder etiology. He will be placed on DVT prophylaxis  Mary Sella. Andrey Campanile, MD, FACS General, Bariatric, & Minimally Invasive Surgery [facility], [place]    [facility] M 03/07/2012, 6:45 AM

## 2012-03-08 ENCOUNTER — Inpatient Hospital Stay (HOSPITAL_COMMUNITY): Payer: Medicaid Other

## 2012-03-08 LAB — COMPREHENSIVE METABOLIC PANEL
BUN: 14 mg/dL (ref 6–23)
CO2: 26 mEq/L (ref 19–32)
Chloride: 104 mEq/L (ref 96–112)
Creatinine, Ser: 1.18 mg/dL (ref 0.50–1.35)
GFR calc non Af Amer: 70 mL/min — ABNORMAL LOW (ref >90)
Glucose, Bld: 114 mg/dL — ABNORMAL HIGH (ref 70–99)
Total Bilirubin: 0.9 mg/dL (ref 0.3–1.2)

## 2012-03-08 LAB — CBC
MCV: 81.5 fL (ref 78.0–100.0)
Platelets: 249 10*3/uL (ref 150–400)
RDW: 13.6 % (ref 11.5–15.5)
WBC: 5 10*3/uL (ref 4.0–10.5)

## 2012-03-08 LAB — MAGNESIUM: Magnesium: 1.9 mg/dL (ref 1.5–2.5)

## 2012-03-08 MED ORDER — METOCLOPRAMIDE HCL 5 MG/ML IJ SOLN
10.0000 mg | Freq: Four times a day (QID) | INTRAMUSCULAR | Status: DC
  Administered 2012-03-08 – 2012-03-09 (×4): 10 mg via INTRAVENOUS
  Filled 2012-03-08 (×9): qty 2

## 2012-03-08 MED ORDER — BISACODYL 10 MG RE SUPP
10.0000 mg | Freq: Once | RECTAL | Status: AC
  Administered 2012-03-08: 10 mg via RECTAL
  Filled 2012-03-08: qty 1

## 2012-03-08 NOTE — Progress Notes (Signed)
Patient ID: Derrick Daniel, male   DOB: 1961-03-12, 51 y.o.   MRN: [id number]    Subjective: Pt feels well.  No nausea or abdominal pain.  Objective: Vital signs in last 24 hours: Temp:  [97.7 F (36.5 C)-98.9 F (37.2 C)] 98.3 F (36.8 C) (08/28 0609) Pulse Rate:  [54-75] 54  (08/28 0609) Resp:  [17-20] 17  (08/28 0609) BP: (113-143)/(60-81) 122/60 mmHg (08/28 0609) SpO2:  [95 %-100 %] 100 % (08/28 0609) Weight:  [236 lb 5.3 oz (107.2 kg)] 236 lb 5.3 oz (107.2 kg) (08/27 0834) Last BM Date: 03/06/12  Intake/Output from previous day: 08/27 0701 - 08/28 0700 In: 1627.5 [P.O.:60; I.V.:1567.5] Out: 840 [Urine:400; Emesis/NG output:440] Intake/Output this shift:    PE: Abd: soft, NT, Nd, +BS  Lab Results:   Basename 03/08/12 0500 03/06/12 2210  WBC 5.0 8.0  HGB 13.3 16.6  HCT 40.9 47.0  PLT 249 306   BMET  Basename 03/08/12 0500 03/06/12 2210  NA 137 138  K 4.3 4.3  CL 104 101  CO2 26 21  GLUCOSE 114* 152*  BUN 14 18  CREATININE 1.18 0.99  CALCIUM 8.8 10.7*   PT/INR No results found for this basename: LABPROT:2,INR:2 in the last 72 hours CMP     Component Value Date/Time   NA 137 03/08/2012 0500   K 4.3 03/08/2012 0500   CL 104 03/08/2012 0500   CO2 26 03/08/2012 0500   GLUCOSE 114* 03/08/2012 0500   BUN 14 03/08/2012 0500   CREATININE 1.18 03/08/2012 0500   CALCIUM 8.8 03/08/2012 0500   PROT 6.5 03/08/2012 0500   ALBUMIN 3.1* 03/08/2012 0500   AST 32 03/08/2012 0500   ALT 41 03/08/2012 0500   ALKPHOS 44 03/08/2012 0500   BILITOT 0.9 03/08/2012 0500   GFRNONAA 70* 03/08/2012 0500   GFRAA 81* 03/08/2012 0500   Lipase     Component Value Date/Time   LIPASE 26 03/06/2012 2210       Studies/Results: Ct Abdomen Pelvis W Contrast  03/07/2012  *RADIOLOGY REPORT*  Clinical Data: Evaluate for small bowel obstruction  CT ABDOMEN AND PELVIS WITH CONTRAST  Technique:  Multidetector CT imaging of the abdomen and pelvis was performed following the standard protocol during  bolus administration of intravenous contrast.  Contrast: 40mL OMNIPAQUE IOHEXOL 300 MG/ML  SOLN, OMNIPAQUE IOHEXOL 300 MG/ML  SOLN  Comparison: 03/07/2012  Findings: Limited images through the lung bases demonstrate no significant appreciable abnormality. The heart size is within normal limits. No pleural or pericardial effusion.  Hepatic steatosis.  Unremarkable spleen, pancreas, adrenal glands, kidneys.  No hydronephrosis or hydroureter.  Decompressed colon and decompressed proximal small bowel loops. Normal appendix.  Mid and distal small bowel loops are mildly dilated up to 3.2 cm, and contain air fluid levels.  There is fecalizatoin of a loop of distal ileum with interloop fluid involving the small bowel mesentery just proximal to this level. Small amount of perihepatic and perisplenic fluid measures water attenuation.  The SMA and SMV are patent.  Normal caliber aorta.  Thin-walled bladder.  No deep pelvic lymphadenopathy.  Small fat containing left inguinal hernia.  Multilevel degenerative changes of the imaged spine. No acute or aggressive appearing osseous lesion.  IMPRESSION: Delayed transit through distal small bowel with associated proximal dilatation is in keeping with a partial obstruction pattern.  The etiology for the delayed transit is uncertain.  May reflect an underlying enteritis (infectious, inflammatory, and ischemic considerations) or early mechanical obstruction.  Recommend correlation  with clinical symptoms and KUB follow-up to document continued passage of ingested contrast if no intervention is performed.  Hepatic steatosis.  Small amount of free fluid is nonspecific.   Original Report Authenticated By: Waneta Martins, M.D.    Dg Abd Acute W/chest  03/07/2012  *RADIOLOGY REPORT*  Clinical Data: Abdominal pain and vomiting.  ACUTE ABDOMEN SERIES (ABDOMEN 2 VIEW & CHEST 1 VIEW)  Comparison: CT abdomen and pelvis 11/28/2009.  Findings: Single view of the chest demonstrates clear  lungs and normal heart size.  No pneumothorax or pleural fluid.  Two views of the abdomen show no free intraperitoneal air.  Air fluid levels are seen in small bowel in the left upper quadrant of the abdomen with small bowel loops dilated up to 3.5 cm identified. A small amount of gas is seen in the colon.  No unexpected calcification.  IMPRESSION:  1.  Findings worrisome for small bowel obstruction. 2.  Negative for free intraperitoneal air.   Original Report Authenticated By: Bernadene Bell. Maricela Curet, M.D.    Dg Abd Portable 2v  03/08/2012  *RADIOLOGY REPORT*  Clinical Data: Abdominal distention.  PORTABLE ABDOMEN - 2 VIEW  Comparison: CT scan 03/07/2012.  Findings: Contrast on the CT scan and has advanced into the colon. There are persistent air filled loops of small bowel and scattered air fluid levels.  Findings suggest a partial or resolving small bowel obstruction.  No free air.  The bony structures are intact.  IMPRESSION:  1.  Progression of contrast through the small bowel and into the colon. 2.  Persistent air filled small bowel loops with scattered air fluid levels.  Findings suggest a partial or resolving small bowel obstruction.   Original Report Authenticated By: P. Loralie Champagne, M.D.     Anti-infectives: Anti-infectives    None       Assessment/Plan  1. Enteritis vs early PSBO  Plan: 1. X-rays noted.  Contrast in colon, but still with slightly dilated loops of small bowel. 1. Will give clear liquids and advance as tolerates.   LOS: 2 days    Terrina Docter E 03/08/2012

## 2012-03-08 NOTE — Progress Notes (Signed)
Agree with above - enteritis vs resolving PSBO Clears Reglan/ Dulcolax suppository.  Wilmon Arms. [name], MD, [facility] Surgery  03/08/2012 9:28 AM

## 2012-03-09 NOTE — Progress Notes (Signed)
Patient discharged to home with instructions, no complaints 

## 2012-03-09 NOTE — Discharge Summary (Signed)
Agree with discharge. No follow-up needed.  Wilmon Arms. [name], MD, [facility]  03/09/2012 9:32 AM

## 2012-03-09 NOTE — Discharge Summary (Signed)
Patient ID: [name] MRN: [id number] DOB/AGE: [birth year] 51 y.o.  Admit date: 03/06/2012 Discharge date: 03/09/2012  Procedures: None  Consults: None  Reason for Admission: This is a 51 yo male who presented to [facility] with c/o abdominal pain n/v.  He had a ct that questioned enteritis vs early PSBO.  He was admitted for further care.  Admission Diagnoses:  1. Enteritis vs early [facility]  Hospital Course: The patient was admitted and had an NGT placed.  This was removed later during HD 1 as it had no significant output.  The patient's pain had resolved at this time. He was started on clear liquids the following day and his diet was advanced as tolerated.  He had no further nausea/vomiting.  He was stable for dc home on HD# 3.  PE: Abd: soft, Nt, ND, +BS  Discharge Diagnoses:  Principal Problem:  *Partial small bowel obstruction   Discharge Medications: Medication List    Notice       You have not been prescribed any medications.             Discharge Instructions: Obtain a primary care physician.  No surgical follow up needed.  Signed: Letha Cape 03/09/2012, 9:14 AM

## 2013-03-30 ENCOUNTER — Ambulatory Visit (INDEPENDENT_AMBULATORY_CARE_PROVIDER_SITE_OTHER): Payer: Medicare Other | Admitting: Surgery

## 2013-04-09 ENCOUNTER — Encounter (INDEPENDENT_AMBULATORY_CARE_PROVIDER_SITE_OTHER): Payer: Self-pay | Admitting: Surgery

## 2014-01-13 ENCOUNTER — Emergency Department (HOSPITAL_COMMUNITY): Payer: No Typology Code available for payment source

## 2014-01-13 ENCOUNTER — Emergency Department (HOSPITAL_COMMUNITY)
Admission: EM | Admit: 2014-01-13 | Discharge: 2014-01-13 | Disposition: A | Discharge status: Home / Self Care | Payer: No Typology Code available for payment source | Attending: Emergency Medicine | Admitting: Emergency Medicine

## 2014-01-13 ENCOUNTER — Encounter (HOSPITAL_COMMUNITY): Payer: Self-pay | Admitting: Emergency Medicine

## 2014-01-13 DIAGNOSIS — F10920 Alcohol use, unspecified with intoxication, uncomplicated: Secondary | ICD-10-CM

## 2014-01-13 DIAGNOSIS — Z87891 Personal history of nicotine dependence: Secondary | ICD-10-CM | POA: Insufficient documentation

## 2014-01-13 DIAGNOSIS — Z8719 Personal history of other diseases of the digestive system: Secondary | ICD-10-CM | POA: Insufficient documentation

## 2014-01-13 DIAGNOSIS — S01511A Laceration without foreign body of lip, initial encounter: Secondary | ICD-10-CM

## 2014-01-13 DIAGNOSIS — Z23 Encounter for immunization: Secondary | ICD-10-CM | POA: Insufficient documentation

## 2014-01-13 DIAGNOSIS — R1011 Right upper quadrant pain: Secondary | ICD-10-CM | POA: Insufficient documentation

## 2014-01-13 DIAGNOSIS — Z8659 Personal history of other mental and behavioral disorders: Secondary | ICD-10-CM | POA: Insufficient documentation

## 2014-01-13 DIAGNOSIS — F1010 Alcohol abuse, uncomplicated: Secondary | ICD-10-CM | POA: Insufficient documentation

## 2014-01-13 DIAGNOSIS — Y92410 Unspecified street and highway as the place of occurrence of the external cause: Secondary | ICD-10-CM | POA: Insufficient documentation

## 2014-01-13 DIAGNOSIS — Y9389 Activity, other specified: Secondary | ICD-10-CM | POA: Insufficient documentation

## 2014-01-13 DIAGNOSIS — R1013 Epigastric pain: Secondary | ICD-10-CM | POA: Insufficient documentation

## 2014-01-13 DIAGNOSIS — Z8619 Personal history of other infectious and parasitic diseases: Secondary | ICD-10-CM | POA: Insufficient documentation

## 2014-01-13 DIAGNOSIS — R079 Chest pain, unspecified: Secondary | ICD-10-CM | POA: Insufficient documentation

## 2014-01-13 DIAGNOSIS — S01501A Unspecified open wound of lip, initial encounter: Secondary | ICD-10-CM | POA: Insufficient documentation

## 2014-01-13 DIAGNOSIS — G8929 Other chronic pain: Secondary | ICD-10-CM | POA: Insufficient documentation

## 2014-01-13 DIAGNOSIS — R51 Headache: Secondary | ICD-10-CM | POA: Insufficient documentation

## 2014-01-13 LAB — CBC WITH DIFFERENTIAL/PLATELET
BASOS ABS: 0 10*3/uL (ref 0.0–0.1)
Basophils Relative: 0 % (ref 0–1)
Eosinophils Absolute: 0.1 10*3/uL (ref 0.0–0.7)
Eosinophils Relative: 1 % (ref 0–5)
HEMATOCRIT: 38.8 % — AB (ref 39.0–52.0)
HEMOGLOBIN: 13.3 g/dL (ref 13.0–17.0)
LYMPHS PCT: 26 % (ref 12–46)
Lymphs Abs: 2.3 10*3/uL (ref 0.7–4.0)
MCH: 27.5 pg (ref 26.0–34.0)
MCHC: 34.3 g/dL (ref 30.0–36.0)
MCV: 80.3 fL (ref 78.0–100.0)
MONO ABS: 0.6 10*3/uL (ref 0.1–1.0)
MONOS PCT: 7 % (ref 3–12)
NEUTROS ABS: 5.7 10*3/uL (ref 1.7–7.7)
NEUTROS PCT: 66 % (ref 43–77)
Platelets: 231 10*3/uL (ref 150–400)
RBC: 4.83 MIL/uL (ref 4.22–5.81)
RDW: 13.4 % (ref 11.5–15.5)
WBC: 8.6 10*3/uL (ref 4.0–10.5)

## 2014-01-13 LAB — I-STAT CHEM 8, ED
BUN: 10 mg/dL (ref 6–23)
CALCIUM ION: 1.15 mmol/L (ref 1.12–1.23)
CHLORIDE: 107 meq/L (ref 96–112)
Creatinine, Ser: 1.3 mg/dL (ref 0.50–1.35)
Glucose, Bld: 113 mg/dL — ABNORMAL HIGH (ref 70–99)
HCT: 43 % (ref 39.0–52.0)
Hemoglobin: 14.6 g/dL (ref 13.0–17.0)
POTASSIUM: 4.1 meq/L (ref 3.7–5.3)
Sodium: 141 mEq/L (ref 137–147)
TCO2: 24 mmol/L (ref 0–100)

## 2014-01-13 LAB — ETHANOL: ALCOHOL ETHYL (B): 111 mg/dL — AB (ref 0–11)

## 2014-01-13 MED ORDER — TETANUS-DIPHTH-ACELL PERTUSSIS 5-2.5-18.5 LF-MCG/0.5 IM SUSP
0.5000 mL | Freq: Once | INTRAMUSCULAR | Status: AC
  Administered 2014-01-13: 0.5 mL via INTRAMUSCULAR
  Filled 2014-01-13: qty 0.5

## 2014-01-13 MED ORDER — MORPHINE SULFATE 4 MG/ML IJ SOLN
4.0000 mg | Freq: Once | INTRAMUSCULAR | Status: AC
  Administered 2014-01-13: 4 mg via INTRAVENOUS
  Filled 2014-01-13: qty 1

## 2014-01-13 MED ORDER — IOHEXOL 300 MG/ML  SOLN
100.0000 mL | Freq: Once | INTRAMUSCULAR | Status: AC | PRN
  Administered 2014-01-13: 100 mL via INTRAVENOUS

## 2014-01-13 MED ORDER — CEPHALEXIN 500 MG PO CAPS: 500.0000 mg | ORAL_CAPSULE | Freq: Four times a day (QID) | ORAL | Status: AC

## 2014-01-13 MED ORDER — FENTANYL CITRATE 0.05 MG/ML IJ SOLN
50.0000 ug | Freq: Once | INTRAMUSCULAR | Status: AC
  Administered 2014-01-13: 50 ug via INTRAVENOUS
  Filled 2014-01-13: qty 2

## 2014-01-13 MED ORDER — SODIUM CHLORIDE 0.9 % IV BOLUS (SEPSIS)
1000.0000 mL | Freq: Once | INTRAVENOUS | Status: AC
  Administered 2014-01-13: 1000 mL via INTRAVENOUS
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
## 2014-01-13 NOTE — ED Notes (Signed)
Dr. Rhunette CroftNanavati at bedside suturing lip laceration
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
